# Patient Record
Sex: Male | Born: 1985 | Race: White | Hispanic: No | Marital: Single | Smoking: Current every day smoker
Health system: Southern US, Community
[De-identification: ages and names within clinical notes are randomized; demographics above are authoritative.]

---

## 2016-05-06 ENCOUNTER — Encounter (HOSPITAL_COMMUNITY): Payer: Self-pay | Admitting: Emergency Medicine

## 2016-05-06 DIAGNOSIS — F17200 Nicotine dependence, unspecified, uncomplicated: Secondary | ICD-10-CM | POA: Diagnosis not present

## 2016-05-06 DIAGNOSIS — H16132 Photokeratitis, left eye: Secondary | ICD-10-CM | POA: Diagnosis not present

## 2016-05-06 DIAGNOSIS — H5712 Ocular pain, left eye: Secondary | ICD-10-CM | POA: Diagnosis present

## 2016-05-06 NOTE — ED Triage Notes (Signed)
Patient with left eye redness and pain after trying to irrigate the eye.  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  He was not wearing eye protect, had some black flecks wash out the first time he irrigated.

## 2016-05-07 ENCOUNTER — Emergency Department (HOSPITAL_COMMUNITY): Payer: BLUE CROSS/BLUE SHIELD

## 2016-05-07 ENCOUNTER — Encounter (HOSPITAL_COMMUNITY): Payer: Self-pay | Admitting: Emergency Medicine

## 2016-05-07 ENCOUNTER — Emergency Department (HOSPITAL_COMMUNITY)
Admission: EM | Admit: 2016-05-07 | Discharge: 2016-05-07 | Disposition: A | Discharge status: Home / Self Care | Payer: BLUE CROSS/BLUE SHIELD | Attending: Emergency Medicine | Admitting: Emergency Medicine

## 2016-05-07 DIAGNOSIS — H5712 Ocular pain, left eye: Secondary | ICD-10-CM

## 2016-05-07 DIAGNOSIS — H16132 Photokeratitis, left eye: Secondary | ICD-10-CM

## 2016-05-07 MED ORDER — FLUORESCEIN SODIUM 0.6 MG OP STRP
1.0000 | ORAL_STRIP | Freq: Once | OPHTHALMIC | Status: AC
  Administered 2016-05-07: 1 via OPHTHALMIC
  Filled 2016-05-07: qty 1

## 2016-05-07 MED ORDER — POLYMYXIN B-TRIMETHOPRIM 10000-0.1 UNIT/ML-% OP SOLN
1.0000 [drp] | OPHTHALMIC | Status: DC
  Administered 2016-05-07: 1 [drp] via OPHTHALMIC
  Filled 2016-05-07: qty 10

## 2016-05-07 MED ORDER — TETRACAINE HCL 0.5 % OP SOLN
1.0000 [drp] | Freq: Once | OPHTHALMIC | Status: AC
  Administered 2016-05-07: 1 [drp] via OPHTHALMIC
  Filled 2016-05-07: qty 2

## 2016-05-07 MED ORDER — TETANUS-DIPHTH-ACELL PERTUSSIS 5-2.5-18.5 LF-MCG/0.5 IM SUSP
0.5000 mL | Freq: Once | INTRAMUSCULAR | Status: AC
  Administered 2016-05-07: 0.5 mL via INTRAMUSCULAR
  Filled 2016-05-07: qty 0.5

## 2016-05-07 MED ORDER — HYPROMELLOSE (GONIOSCOPIC) 2.5 % OP SOLN: 1.0000 [drp] | Freq: Three times a day (TID) | OPHTHALMIC | 0 refills | Status: AC | PRN

## 2016-05-07 NOTE — Discharge Instructions (Signed)
Your eye pain is likely due to photokeratitis, which is a painful eye condition caused by exposure of in sufficiently protected eye from ultraviolet rays, or electric arc from welding source. This is similar to a sunburn of your cornea and conjunctiva.  Apply cool, wet compresses over the eye and use artificial tear which will help with your symptoms. Take ibuprofen or Tylenol as needed for pain.  Your condition will likely improve in 24-72 hrs.  Avoid rubbing eye, stay in a dark room or wear sun glasses for comfort.  If you notice eye matted shut, discharge or itch in your eye, then start using antibiotic eye drops 1 drop to left eye every 4 hrs while awake for the next 5 days.  Follow up with eye specialist as needed for further care.

## 2016-05-07 NOTE — ED Notes (Signed)
Patient gone to CT. 

## 2016-05-07 NOTE — ED Notes (Signed)
Woods lamp, [employer] pen, portable eye exam machine, and electronic visual acuity machine are present in Triage 3

## 2016-05-07 NOTE — ED Provider Notes (Signed)
[facility]-EMERGENCY DEPT Provider Note   CSN: [id number] Arrival date & time: 05/06/16  2310     History   Chief Complaint Chief Complaint  Patient presents with  . Foreign Body in Eye    HPI Jose Rich is a 31 y.o. male.  HPI   31 year old male presents for evaluation of left eye pain. 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 He was not wearing any eye protection but was in close proximity to the other person.  He did not remember anything hitting his eye and did not have any initial pain until a few hrs later when he report gradual onset of throbbing achy pain to L eye with associate redness and teary eye.  He did tried irrigating it with water prior to arrival.  He did take ibuprofen as well.  He now report mild L eye discomfort, but no diplopia or loss of vision.  No pain with eye movement, no fb sensation.  Patient does not wear contact lens. No history of diabetes or sickle cell disease. Patient is not up-to-date with tetanus. No other complaint.  History reviewed. No pertinent past medical history.  There are no active problems to display for this patient.   History reviewed. No pertinent surgical history.     Home Medications    Prior to Admission medications   Not on File    Family History History reviewed. No pertinent family history.  Social History Social History  Substance Use Topics  . Smoking status: Current Every Day Smoker  . Smokeless tobacco: Current User  . Alcohol use Yes     Allergies   Codeine   Review of Systems Review of Systems  Constitutional: Negative for fever.  HENT: Negative for ear pain.   Eyes: Positive for photophobia, pain and redness. Negative for discharge, itching and visual disturbance.     Physical Exam Updated Vital Signs BP 118/72 (BP Location: Right Arm)   Pulse (!) 53   Temp 98.3 F (36.8 C) (Oral)   Resp 18   SpO2 97%   Physical Exam  Constitutional: He appears  well-developed and well-nourished. No distress.  HENT:  Head: Atraumatic.  Right Ear: External ear normal.  Left Ear: External ear normal.  Eyes: EOM and lids are normal. Pupils are equal, round, and reactive to light. Lids are everted and swept, no foreign bodies found. No foreign body present in the right eye. Left eye exhibits no chemosis, no discharge, no exudate and no hordeolum. No foreign body present in the left eye. Right conjunctiva is not injected. Right conjunctiva has no hemorrhage. Left conjunctiva is injected. Left conjunctiva has no hemorrhage. No scleral icterus.  Slit lamp exam:      The right eye shows no corneal abrasion.       The left eye shows no corneal abrasion, no corneal flare, no corneal ulcer, no foreign body, no hyphema, no hypopyon, no fluorescein uptake and no anterior chamber bulge.  Left eyes injected with a small sub-conjunctiva hemorrhage to the medial canthus.  Neck: Neck supple.  Neurological: He is alert.  Skin: No rash noted.  Psychiatric: He has a normal mood and affect.  Nursing note and vitals reviewed.    ED Treatments / Results  Labs (all labs ordered are listed, but only abnormal results are displayed) Labs Reviewed - No data to display  EKG  EKG Interpretation None       Radiology Ct Orbits Wo Contrast  Result  Date: 05/07/2016 CLINICAL DATA:  Sensation of foreign body in the left eye. 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 EXAM: CT ORBITS WITHOUT CONTRAST TECHNIQUE: Multidetector CT images were obtained using the standard protocol without intravenous contrast. COMPARISON:  None. FINDINGS: Orbits: --Globes: Normal.  No radiopaque foreign body. --Bony orbit: Normal. --Preseptal soft tissues: Normal. --Intra- and extraconal orbital fat: Normal. No inflammatory stranding. --Optic nerves: Normal. --Lacrimal glands and fossae: Normal. --Extraocular muscles: Normal. Visualized brain: Normal. Visualized paranasal sinuses: No fluid levels or  advanced mucosal thickening. Visualized skull: Normal. IMPRESSION: Normal CT of the orbits. Specifically, no radiopaque foreign body in the left globe or orbit. Electronically Signed   By: Deatra Robinson M.D.   On: 05/07/2016 04:31    Procedures Procedures (including critical care time)  Medications Ordered in ED Medications  Tdap (BOOSTRIX) injection 0.5 mL (0.5 mLs Intramuscular Given 05/07/16 0309)  fluorescein ophthalmic strip 1 strip (1 strip Left Eye Given 05/07/16 0308)  tetracaine (PONTOCAINE) 0.5 % ophthalmic solution 1 drop (1 drop Left Eye Given 05/07/16 0308)     Initial Impression / Assessment and Plan / ED Course  I have reviewed the triage vital signs and the nursing notes.  Pertinent labs & imaging results that were available during my care of the patient were reviewed by me and considered in my medical decision making (see chart for details).  Clinical Course     BP 118/72 (BP Location: Right Arm)   Pulse (!) 53   Temp 98.3 F (36.8 C) (Oral)   Resp 18   SpO2 97%    Final Clinical Impressions(s) / ED Diagnoses   Final diagnoses:  Left eye pain  Photokeratitis of left eye    New Prescriptions New Prescriptions   HYDROXYPROPYL METHYLCELLULOSE / HYPROMELLOSE (ISOPTO TEARS / GONIOVISC) 2.5 % OPHTHALMIC SOLUTION    Place 1 drop into the left eye 3 (three) times daily as needed for dry eyes.   5:33 AM Pt here with left eye pain and redness. 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 Left eye is injected. Patient has normal visual acuity, no evidence of corneal abrasion, no foreign object noted, orbital CT scan without concerning finding. Patient reports resolution of pain after receiving tetracaine eye drop.  IOP is 13 (normal), doubt acute closed angle glaucoma.  Suspect keratitis causing pain or early sign of conjunctivitis.  Will update tdap, and prescribe polytrim to cover for potential infection.  Ophthalmology referral given as needed.      Fayrene Helper, PA-C 05/07/16 0557    Layla Maw Ward, DO 05/07/16 [phone]

## 2016-05-07 NOTE — ED Notes (Signed)
Fayrene HelperBowie Tran, PA at bedside with patient/family member.

## 2018-07-25 IMAGING — CT CT ORBITS W/O CM
3 series · 14 of 47 positions shown, 16 images · non-contrast
Comparison: None.

CLINICAL DATA: Sensation of foreign body in the left eye. Working
around welding earlier today.

EXAM:
CT ORBITS WITHOUT CONTRAST
TECHNIQUE: Multidetector CT images were obtained using the standard protocol
without intravenous contrast.

[Series 2: facial/ orbits 2.0 h30s · axial · 0.31mm/px · z∈[-82,-24]mm · 8 of 35 slices shown, 10 images]
[im 3/35  brain]
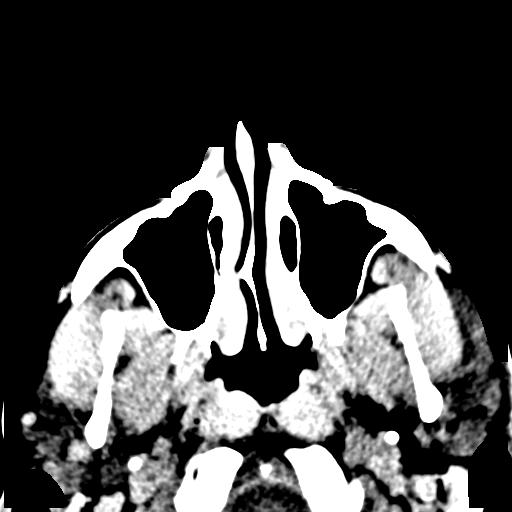
[im 3/35  bone]
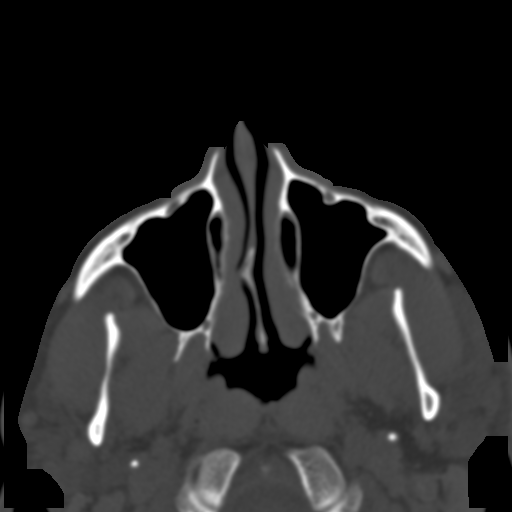
[im 8/35  bone]
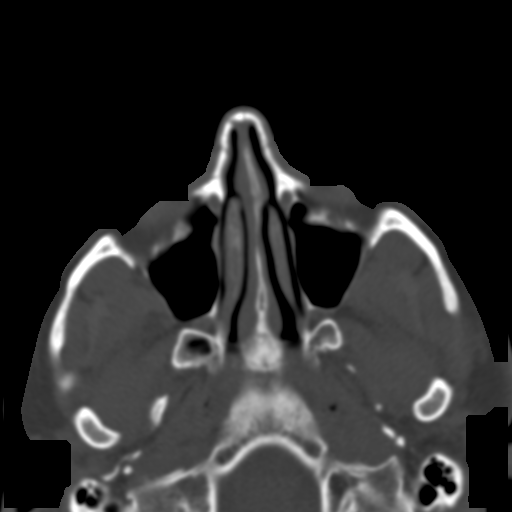
[im 11/35  bone]
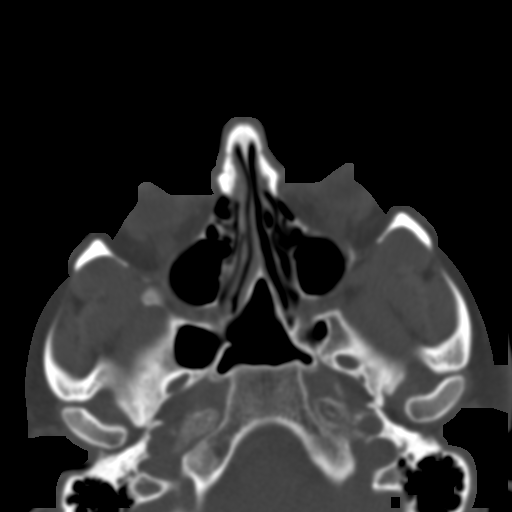
[im 16/35  bone]
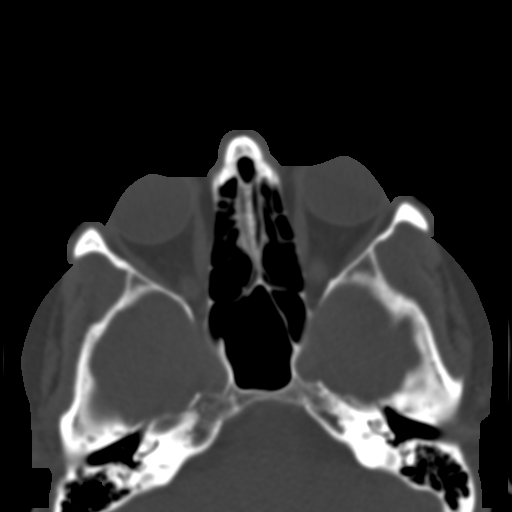
[im 19/35  brain]
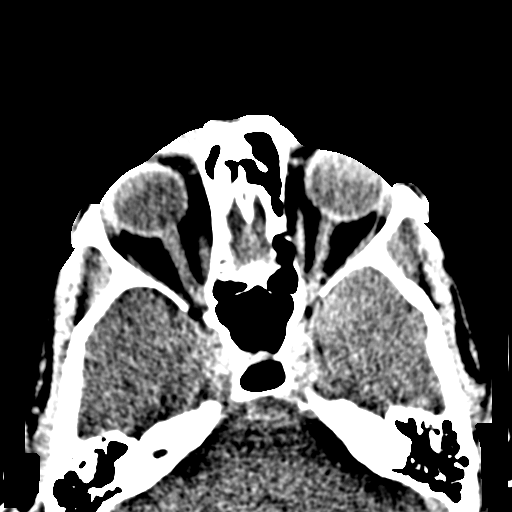
[im 19/35  bone]
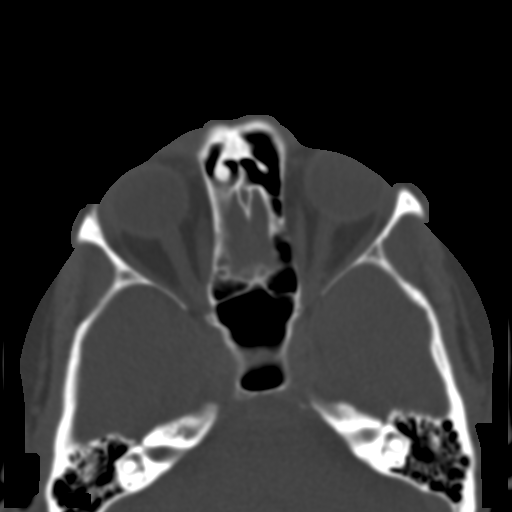
[im 24/35  bone]
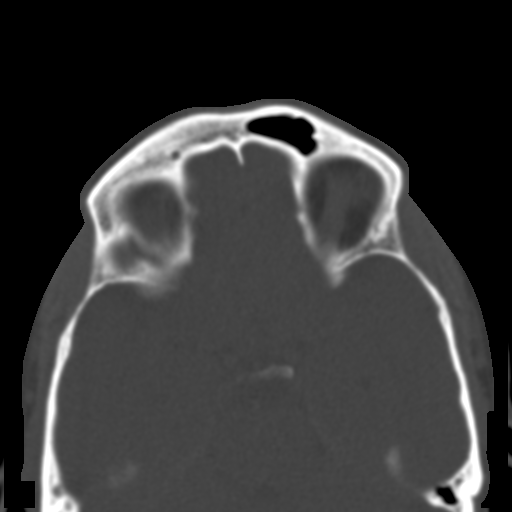
[im 27/35  bone]
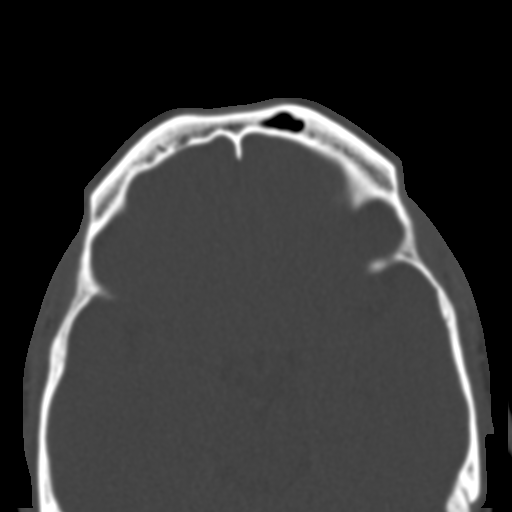
[im 32/35  bone]
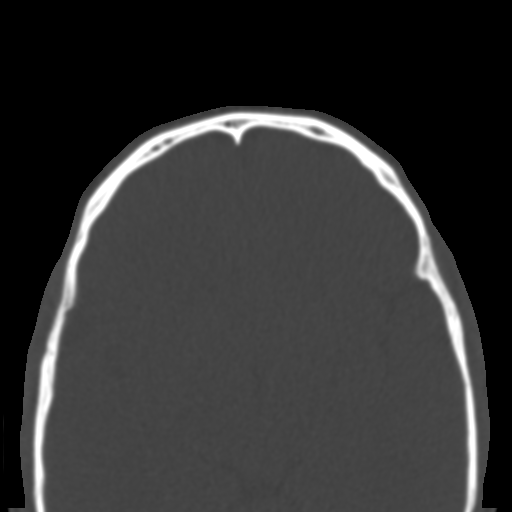

[Series 6: coronal soft tissue · coronal · 0.20mm/px · 3 of 42 slices shown]
[im 14/42  bone]
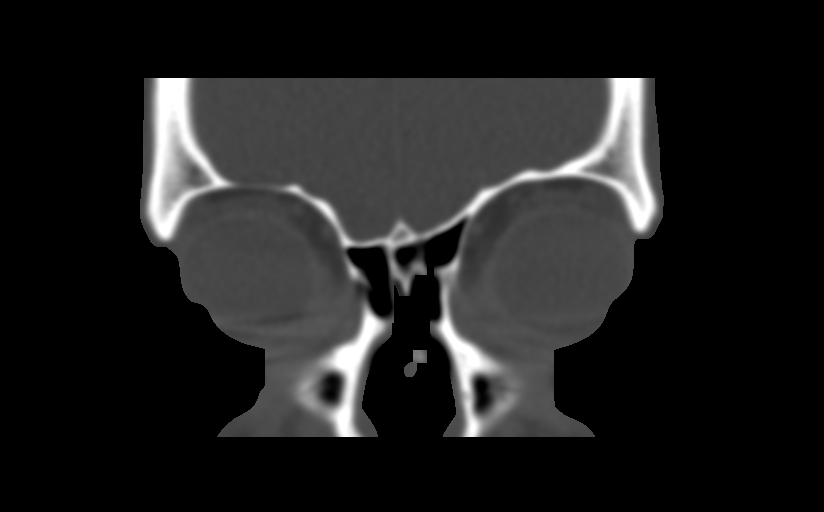
[im 19/42  bone]
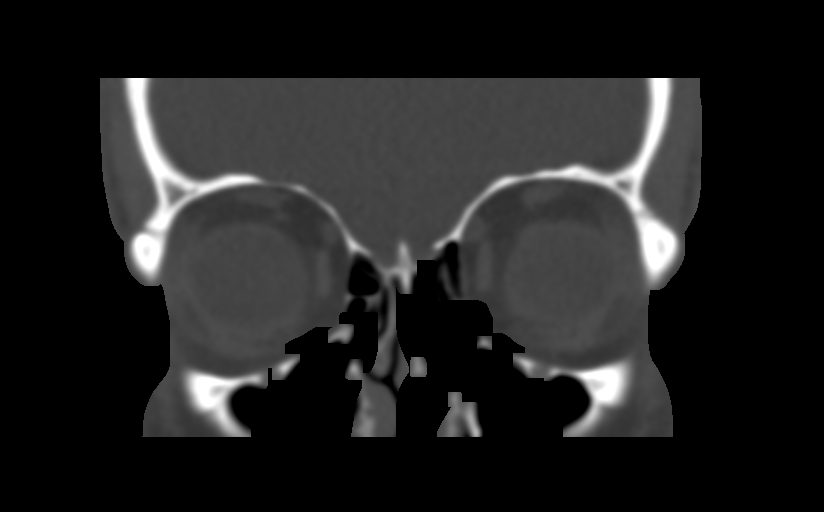
[im 23/42  bone]
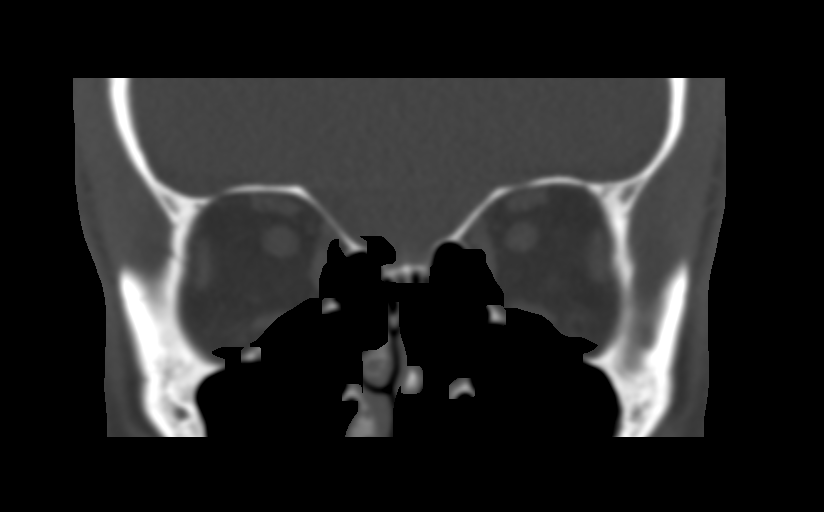

[Series 8: sagittal soft tissue · sagittal · 0.17mm/px · 3 of 82 slices shown]
[im 28/82  bone]
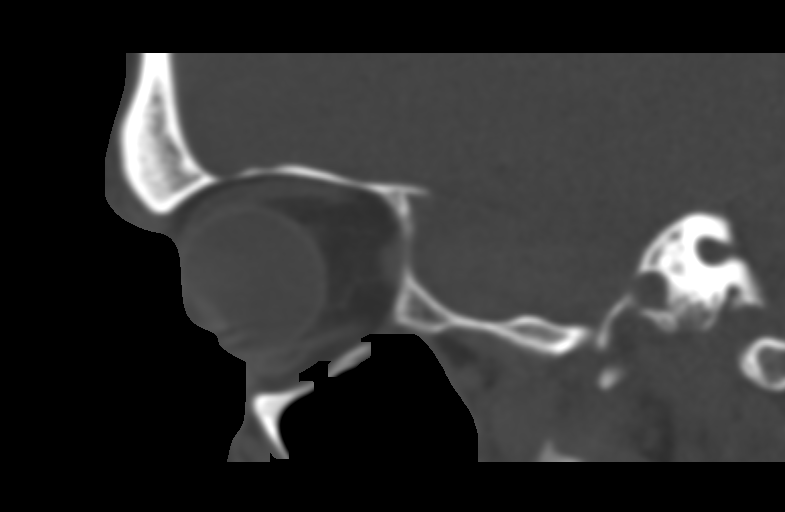
[im 41/82  bone]
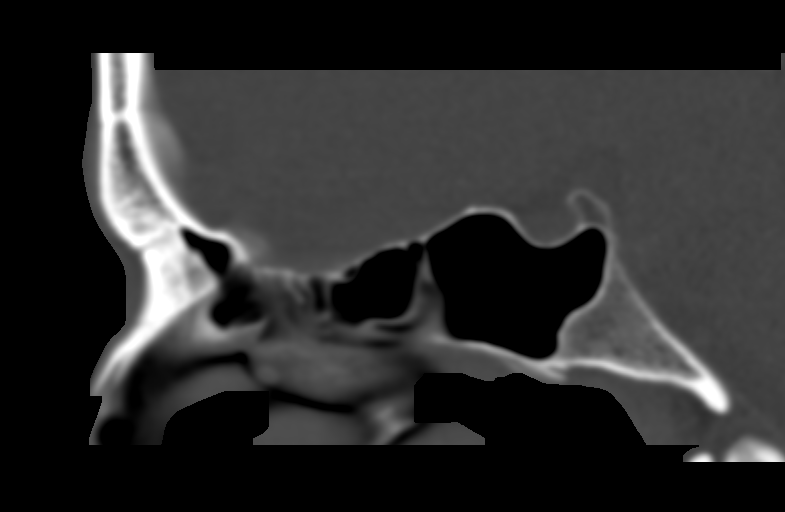
[im 55/82  bone]
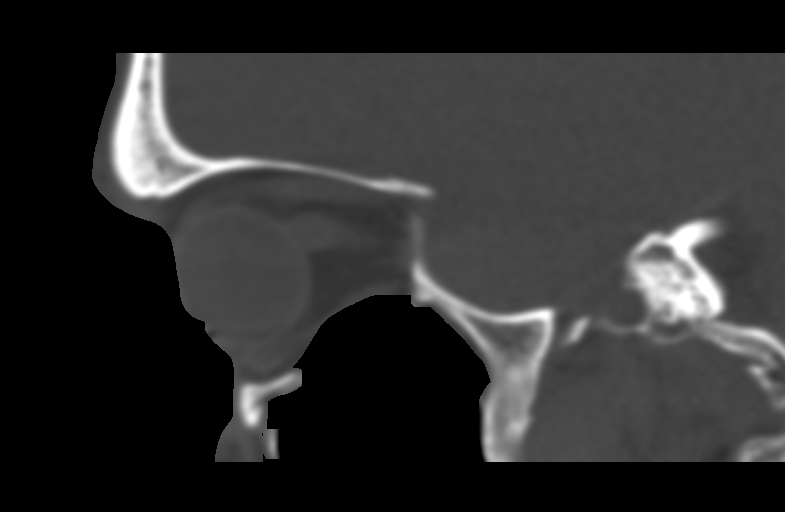

[14 of 47 positions shown; findings below may reference images not displayed]

FINDINGS: Orbits:

--Globes: Normal.  No radiopaque foreign body.

--Bony orbit: Normal.

--Preseptal soft tissues: Normal.

--Intra- and extraconal orbital fat: Normal. No inflammatory
stranding.

--Optic nerves: Normal.

--Lacrimal glands and fossae: Normal.

--Extraocular muscles: Normal.

Visualized brain: Normal.

Visualized paranasal sinuses: No fluid levels or advanced mucosal
thickening.

Visualized skull: Normal.
IMPRESSION: Normal CT of the orbits. Specifically, no radiopaque foreign body in
the left globe or orbit.
